# Patient Record
Sex: Female | Born: 1940
Health system: Southern US, Community
[De-identification: ages and names within clinical notes are randomized; demographics above are authoritative.]

## PROBLEM LIST (undated history)

## (undated) HISTORY — PX: TONSILECTOMY, ADENOIDECTOMY, BILATERAL MYRINGOTOMY AND TUBES: SHX2538

---

## 2000-06-25 ENCOUNTER — Encounter: Payer: Self-pay | Admitting: General Surgery

## 2000-06-25 ENCOUNTER — Encounter: Admission: RE | Admit: 2000-06-25 | Discharge: 2000-06-25 | Payer: Self-pay | Admitting: General Surgery

## 2001-07-13 ENCOUNTER — Encounter: Payer: Self-pay | Admitting: Unknown Physician Specialty

## 2001-07-13 ENCOUNTER — Encounter: Admission: RE | Admit: 2001-07-13 | Discharge: 2001-07-13 | Payer: Self-pay | Admitting: Unknown Physician Specialty

## 2002-07-14 ENCOUNTER — Encounter: Payer: Self-pay | Admitting: Unknown Physician Specialty

## 2002-07-14 ENCOUNTER — Encounter: Admission: RE | Admit: 2002-07-14 | Discharge: 2002-07-14 | Payer: Self-pay | Admitting: Unknown Physician Specialty

## 2003-09-12 ENCOUNTER — Encounter: Admission: RE | Admit: 2003-09-12 | Discharge: 2003-09-12 | Payer: Self-pay | Admitting: Unknown Physician Specialty

## 2003-09-28 ENCOUNTER — Encounter: Admission: RE | Admit: 2003-09-28 | Discharge: 2003-09-28 | Payer: Self-pay | Admitting: Unknown Physician Specialty

## 2004-10-16 ENCOUNTER — Encounter: Admission: RE | Admit: 2004-10-16 | Discharge: 2004-10-16 | Payer: Self-pay | Admitting: Unknown Physician Specialty

## 2005-12-31 ENCOUNTER — Encounter: Admission: RE | Admit: 2005-12-31 | Discharge: 2005-12-31 | Payer: Self-pay | Admitting: Unknown Physician Specialty

## 2007-02-24 ENCOUNTER — Encounter: Admission: RE | Admit: 2007-02-24 | Discharge: 2007-02-24 | Payer: Self-pay | Admitting: Unknown Physician Specialty

## 2008-02-29 ENCOUNTER — Encounter: Admission: RE | Admit: 2008-02-29 | Discharge: 2008-02-29 | Payer: Self-pay | Admitting: Internal Medicine

## 2009-03-13 ENCOUNTER — Encounter: Admission: RE | Admit: 2009-03-13 | Discharge: 2009-03-13 | Payer: Self-pay | Admitting: Internal Medicine

## 2010-04-04 ENCOUNTER — Encounter: Admission: RE | Admit: 2010-04-04 | Discharge: 2010-04-04 | Payer: Self-pay | Admitting: Internal Medicine

## 2011-05-26 ENCOUNTER — Other Ambulatory Visit: Payer: Self-pay | Admitting: Internal Medicine

## 2011-05-26 DIAGNOSIS — Z1231 Encounter for screening mammogram for malignant neoplasm of breast: Secondary | ICD-10-CM

## 2011-06-03 ENCOUNTER — Ambulatory Visit: Payer: Self-pay

## 2011-07-02 ENCOUNTER — Ambulatory Visit: Payer: Self-pay

## 2011-07-16 ENCOUNTER — Ambulatory Visit: Payer: Self-pay

## 2011-08-08 ENCOUNTER — Ambulatory Visit
Admission: RE | Admit: 2011-08-08 | Discharge: 2011-08-08 | Disposition: A | Discharge status: Home / Self Care | Payer: Medicare Other | Source: Ambulatory Visit | Attending: Internal Medicine | Admitting: Internal Medicine

## 2011-08-08 DIAGNOSIS — Z1231 Encounter for screening mammogram for malignant neoplasm of breast: Secondary | ICD-10-CM

## 2012-07-12 ENCOUNTER — Other Ambulatory Visit: Payer: Self-pay | Admitting: Internal Medicine

## 2012-07-12 DIAGNOSIS — Z1231 Encounter for screening mammogram for malignant neoplasm of breast: Secondary | ICD-10-CM

## 2012-08-17 ENCOUNTER — Ambulatory Visit: Payer: Medicare Other

## 2012-08-25 ENCOUNTER — Ambulatory Visit
Admission: RE | Admit: 2012-08-25 | Discharge: 2012-08-25 | Disposition: A | Discharge status: Home / Self Care | Payer: Medicare Other | Source: Ambulatory Visit | Attending: Internal Medicine | Admitting: Internal Medicine

## 2012-08-25 DIAGNOSIS — Z1231 Encounter for screening mammogram for malignant neoplasm of breast: Secondary | ICD-10-CM

## 2013-07-27 ENCOUNTER — Other Ambulatory Visit: Payer: Self-pay

## 2013-07-27 DIAGNOSIS — Z1231 Encounter for screening mammogram for malignant neoplasm of breast: Secondary | ICD-10-CM

## 2013-08-30 ENCOUNTER — Ambulatory Visit: Payer: Medicare Other

## 2013-09-07 ENCOUNTER — Ambulatory Visit
Admission: RE | Admit: 2013-09-07 | Discharge: 2013-09-07 | Disposition: A | Discharge status: Home / Self Care | Payer: Medicare Other | Source: Ambulatory Visit

## 2013-09-07 DIAGNOSIS — Z1231 Encounter for screening mammogram for malignant neoplasm of breast: Secondary | ICD-10-CM

## 2013-09-28 ENCOUNTER — Ambulatory Visit: Payer: Medicare Other

## 2014-09-14 ENCOUNTER — Other Ambulatory Visit: Payer: Self-pay

## 2014-09-14 DIAGNOSIS — Z1231 Encounter for screening mammogram for malignant neoplasm of breast: Secondary | ICD-10-CM

## 2014-09-27 ENCOUNTER — Ambulatory Visit
Admission: RE | Admit: 2014-09-27 | Discharge: 2014-09-27 | Disposition: A | Discharge status: Home / Self Care | Payer: Medicare Other | Source: Ambulatory Visit

## 2014-09-27 ENCOUNTER — Other Ambulatory Visit: Payer: Self-pay

## 2014-09-27 DIAGNOSIS — Z1231 Encounter for screening mammogram for malignant neoplasm of breast: Secondary | ICD-10-CM

## 2015-09-20 ENCOUNTER — Other Ambulatory Visit: Payer: Self-pay

## 2015-09-20 DIAGNOSIS — Z1231 Encounter for screening mammogram for malignant neoplasm of breast: Secondary | ICD-10-CM

## 2015-10-11 DIAGNOSIS — L570 Actinic keratosis: Secondary | ICD-10-CM | POA: Diagnosis not present

## 2015-10-17 ENCOUNTER — Ambulatory Visit
Admission: RE | Admit: 2015-10-17 | Discharge: 2015-10-17 | Disposition: A | Discharge status: Home / Self Care | Payer: Medicare Other | Source: Ambulatory Visit

## 2015-10-17 DIAGNOSIS — Z1231 Encounter for screening mammogram for malignant neoplasm of breast: Secondary | ICD-10-CM

## 2015-10-19 DIAGNOSIS — E7800 Pure hypercholesterolemia, unspecified: Secondary | ICD-10-CM | POA: Diagnosis not present

## 2015-11-13 DIAGNOSIS — Z418 Encounter for other procedures for purposes other than remedying health state: Secondary | ICD-10-CM | POA: Diagnosis not present

## 2015-11-13 DIAGNOSIS — Z87891 Personal history of nicotine dependence: Secondary | ICD-10-CM | POA: Diagnosis not present

## 2015-11-13 DIAGNOSIS — L539 Erythematous condition, unspecified: Secondary | ICD-10-CM | POA: Diagnosis not present

## 2015-11-13 DIAGNOSIS — W57XXXA Bitten or stung by nonvenomous insect and other nonvenomous arthropods, initial encounter: Secondary | ICD-10-CM | POA: Diagnosis not present

## 2016-01-14 DIAGNOSIS — E039 Hypothyroidism, unspecified: Secondary | ICD-10-CM | POA: Diagnosis not present

## 2016-01-14 DIAGNOSIS — E7800 Pure hypercholesterolemia, unspecified: Secondary | ICD-10-CM | POA: Diagnosis not present

## 2016-01-14 DIAGNOSIS — Z299 Encounter for prophylactic measures, unspecified: Secondary | ICD-10-CM | POA: Diagnosis not present

## 2016-01-14 DIAGNOSIS — M79671 Pain in right foot: Secondary | ICD-10-CM | POA: Diagnosis not present

## 2016-01-14 DIAGNOSIS — Z6824 Body mass index (BMI) 24.0-24.9, adult: Secondary | ICD-10-CM | POA: Diagnosis not present

## 2016-02-19 DIAGNOSIS — M1812 Unilateral primary osteoarthritis of first carpometacarpal joint, left hand: Secondary | ICD-10-CM | POA: Diagnosis not present

## 2016-02-19 DIAGNOSIS — M25532 Pain in left wrist: Secondary | ICD-10-CM | POA: Diagnosis not present

## 2016-02-19 DIAGNOSIS — E7800 Pure hypercholesterolemia, unspecified: Secondary | ICD-10-CM | POA: Diagnosis not present

## 2016-02-19 DIAGNOSIS — E039 Hypothyroidism, unspecified: Secondary | ICD-10-CM | POA: Diagnosis not present

## 2016-02-19 DIAGNOSIS — S6992XA Unspecified injury of left wrist, hand and finger(s), initial encounter: Secondary | ICD-10-CM | POA: Diagnosis not present

## 2016-03-04 DIAGNOSIS — M25532 Pain in left wrist: Secondary | ICD-10-CM | POA: Diagnosis not present

## 2016-03-04 DIAGNOSIS — M1812 Unilateral primary osteoarthritis of first carpometacarpal joint, left hand: Secondary | ICD-10-CM | POA: Diagnosis not present

## 2016-03-04 DIAGNOSIS — M654 Radial styloid tenosynovitis [de Quervain]: Secondary | ICD-10-CM | POA: Diagnosis not present

## 2016-04-21 DIAGNOSIS — M8580 Other specified disorders of bone density and structure, unspecified site: Secondary | ICD-10-CM | POA: Diagnosis not present

## 2016-04-21 DIAGNOSIS — E039 Hypothyroidism, unspecified: Secondary | ICD-10-CM | POA: Diagnosis not present

## 2016-06-09 DIAGNOSIS — Z23 Encounter for immunization: Secondary | ICD-10-CM | POA: Diagnosis not present

## 2016-07-21 DIAGNOSIS — Z79899 Other long term (current) drug therapy: Secondary | ICD-10-CM | POA: Diagnosis not present

## 2016-07-21 DIAGNOSIS — Z1211 Encounter for screening for malignant neoplasm of colon: Secondary | ICD-10-CM | POA: Diagnosis not present

## 2016-07-21 DIAGNOSIS — Z299 Encounter for prophylactic measures, unspecified: Secondary | ICD-10-CM | POA: Diagnosis not present

## 2016-07-21 DIAGNOSIS — Z7189 Other specified counseling: Secondary | ICD-10-CM | POA: Diagnosis not present

## 2016-07-21 DIAGNOSIS — Z6825 Body mass index (BMI) 25.0-25.9, adult: Secondary | ICD-10-CM | POA: Diagnosis not present

## 2016-07-21 DIAGNOSIS — Z1389 Encounter for screening for other disorder: Secondary | ICD-10-CM | POA: Diagnosis not present

## 2016-07-21 DIAGNOSIS — E039 Hypothyroidism, unspecified: Secondary | ICD-10-CM | POA: Diagnosis not present

## 2016-07-21 DIAGNOSIS — Z0000 Encounter for general adult medical examination without abnormal findings: Secondary | ICD-10-CM | POA: Diagnosis not present

## 2016-07-21 DIAGNOSIS — E7800 Pure hypercholesterolemia, unspecified: Secondary | ICD-10-CM | POA: Diagnosis not present

## 2016-07-30 ENCOUNTER — Other Ambulatory Visit: Payer: Self-pay | Admitting: Internal Medicine

## 2016-07-30 DIAGNOSIS — Z1231 Encounter for screening mammogram for malignant neoplasm of breast: Secondary | ICD-10-CM

## 2016-09-11 DIAGNOSIS — E2839 Other primary ovarian failure: Secondary | ICD-10-CM | POA: Diagnosis not present

## 2016-10-17 ENCOUNTER — Ambulatory Visit
Admission: RE | Admit: 2016-10-17 | Discharge: 2016-10-17 | Disposition: A | Discharge status: Home / Self Care | Payer: Medicare Other | Source: Ambulatory Visit | Attending: Internal Medicine | Admitting: Internal Medicine

## 2016-10-17 DIAGNOSIS — Z1231 Encounter for screening mammogram for malignant neoplasm of breast: Secondary | ICD-10-CM | POA: Diagnosis not present

## 2017-02-05 DIAGNOSIS — E7800 Pure hypercholesterolemia, unspecified: Secondary | ICD-10-CM | POA: Diagnosis not present

## 2017-02-05 DIAGNOSIS — E039 Hypothyroidism, unspecified: Secondary | ICD-10-CM | POA: Diagnosis not present

## 2017-02-05 DIAGNOSIS — Z713 Dietary counseling and surveillance: Secondary | ICD-10-CM | POA: Diagnosis not present

## 2017-02-05 DIAGNOSIS — Z6822 Body mass index (BMI) 22.0-22.9, adult: Secondary | ICD-10-CM | POA: Diagnosis not present

## 2017-02-05 DIAGNOSIS — Z299 Encounter for prophylactic measures, unspecified: Secondary | ICD-10-CM | POA: Diagnosis not present

## 2017-02-06 DIAGNOSIS — E7800 Pure hypercholesterolemia, unspecified: Secondary | ICD-10-CM | POA: Diagnosis not present

## 2017-02-06 DIAGNOSIS — E039 Hypothyroidism, unspecified: Secondary | ICD-10-CM | POA: Diagnosis not present

## 2017-03-06 DIAGNOSIS — E7800 Pure hypercholesterolemia, unspecified: Secondary | ICD-10-CM | POA: Diagnosis not present

## 2017-03-06 DIAGNOSIS — W57XXXA Bitten or stung by nonvenomous insect and other nonvenomous arthropods, initial encounter: Secondary | ICD-10-CM | POA: Diagnosis not present

## 2017-03-06 DIAGNOSIS — Z6822 Body mass index (BMI) 22.0-22.9, adult: Secondary | ICD-10-CM | POA: Diagnosis not present

## 2017-03-06 DIAGNOSIS — Z299 Encounter for prophylactic measures, unspecified: Secondary | ICD-10-CM | POA: Diagnosis not present

## 2017-03-06 DIAGNOSIS — M8580 Other specified disorders of bone density and structure, unspecified site: Secondary | ICD-10-CM | POA: Diagnosis not present

## 2017-03-06 DIAGNOSIS — E039 Hypothyroidism, unspecified: Secondary | ICD-10-CM | POA: Diagnosis not present

## 2017-06-22 DIAGNOSIS — Z23 Encounter for immunization: Secondary | ICD-10-CM | POA: Diagnosis not present

## 2017-07-24 DIAGNOSIS — Z1339 Encounter for screening examination for other mental health and behavioral disorders: Secondary | ICD-10-CM | POA: Diagnosis not present

## 2017-07-24 DIAGNOSIS — D229 Melanocytic nevi, unspecified: Secondary | ICD-10-CM | POA: Diagnosis not present

## 2017-07-24 DIAGNOSIS — Z7189 Other specified counseling: Secondary | ICD-10-CM | POA: Diagnosis not present

## 2017-07-24 DIAGNOSIS — Z6823 Body mass index (BMI) 23.0-23.9, adult: Secondary | ICD-10-CM | POA: Diagnosis not present

## 2017-07-24 DIAGNOSIS — Z1231 Encounter for screening mammogram for malignant neoplasm of breast: Secondary | ICD-10-CM | POA: Diagnosis not present

## 2017-07-24 DIAGNOSIS — Z79899 Other long term (current) drug therapy: Secondary | ICD-10-CM | POA: Diagnosis not present

## 2017-07-24 DIAGNOSIS — Z1331 Encounter for screening for depression: Secondary | ICD-10-CM | POA: Diagnosis not present

## 2017-07-24 DIAGNOSIS — Z0000 Encounter for general adult medical examination without abnormal findings: Secondary | ICD-10-CM | POA: Diagnosis not present

## 2017-07-24 DIAGNOSIS — E039 Hypothyroidism, unspecified: Secondary | ICD-10-CM | POA: Diagnosis not present

## 2017-07-24 DIAGNOSIS — E7800 Pure hypercholesterolemia, unspecified: Secondary | ICD-10-CM | POA: Diagnosis not present

## 2017-07-24 DIAGNOSIS — Z299 Encounter for prophylactic measures, unspecified: Secondary | ICD-10-CM | POA: Diagnosis not present

## 2017-07-30 DIAGNOSIS — Z79899 Other long term (current) drug therapy: Secondary | ICD-10-CM | POA: Diagnosis not present

## 2017-07-30 DIAGNOSIS — E7800 Pure hypercholesterolemia, unspecified: Secondary | ICD-10-CM | POA: Diagnosis not present

## 2017-07-30 DIAGNOSIS — E039 Hypothyroidism, unspecified: Secondary | ICD-10-CM | POA: Diagnosis not present

## 2017-08-20 DIAGNOSIS — L308 Other specified dermatitis: Secondary | ICD-10-CM | POA: Diagnosis not present

## 2017-08-20 DIAGNOSIS — Z1283 Encounter for screening for malignant neoplasm of skin: Secondary | ICD-10-CM | POA: Diagnosis not present

## 2017-08-20 DIAGNOSIS — D225 Melanocytic nevi of trunk: Secondary | ICD-10-CM | POA: Diagnosis not present

## 2017-11-16 ENCOUNTER — Other Ambulatory Visit: Payer: Self-pay | Admitting: Internal Medicine

## 2017-11-16 DIAGNOSIS — Z1231 Encounter for screening mammogram for malignant neoplasm of breast: Secondary | ICD-10-CM

## 2017-11-18 ENCOUNTER — Ambulatory Visit: Payer: Medicare Other

## 2017-11-30 DIAGNOSIS — H6690 Otitis media, unspecified, unspecified ear: Secondary | ICD-10-CM | POA: Diagnosis not present

## 2017-11-30 DIAGNOSIS — E039 Hypothyroidism, unspecified: Secondary | ICD-10-CM | POA: Diagnosis not present

## 2017-11-30 DIAGNOSIS — Z87891 Personal history of nicotine dependence: Secondary | ICD-10-CM | POA: Diagnosis not present

## 2017-11-30 DIAGNOSIS — Z6823 Body mass index (BMI) 23.0-23.9, adult: Secondary | ICD-10-CM | POA: Diagnosis not present

## 2017-11-30 DIAGNOSIS — Z299 Encounter for prophylactic measures, unspecified: Secondary | ICD-10-CM | POA: Diagnosis not present

## 2017-12-09 ENCOUNTER — Ambulatory Visit
Admission: RE | Admit: 2017-12-09 | Discharge: 2017-12-09 | Disposition: A | Discharge status: Home / Self Care | Payer: Medicare Other | Source: Ambulatory Visit | Attending: Internal Medicine | Admitting: Internal Medicine

## 2017-12-09 DIAGNOSIS — Z1231 Encounter for screening mammogram for malignant neoplasm of breast: Secondary | ICD-10-CM | POA: Diagnosis not present

## 2018-02-24 ENCOUNTER — Encounter: Payer: Self-pay | Admitting: Orthopaedic Surgery

## 2018-02-24 DIAGNOSIS — Z713 Dietary counseling and surveillance: Secondary | ICD-10-CM | POA: Diagnosis not present

## 2018-02-24 DIAGNOSIS — Z6824 Body mass index (BMI) 24.0-24.9, adult: Secondary | ICD-10-CM | POA: Diagnosis not present

## 2018-02-24 DIAGNOSIS — E039 Hypothyroidism, unspecified: Secondary | ICD-10-CM | POA: Diagnosis not present

## 2018-02-24 DIAGNOSIS — Z299 Encounter for prophylactic measures, unspecified: Secondary | ICD-10-CM | POA: Diagnosis not present

## 2018-02-24 DIAGNOSIS — M1711 Unilateral primary osteoarthritis, right knee: Secondary | ICD-10-CM | POA: Diagnosis not present

## 2018-02-24 DIAGNOSIS — M25561 Pain in right knee: Secondary | ICD-10-CM | POA: Diagnosis not present

## 2018-03-12 DIAGNOSIS — Z79899 Other long term (current) drug therapy: Secondary | ICD-10-CM | POA: Diagnosis not present

## 2018-03-12 DIAGNOSIS — Z713 Dietary counseling and surveillance: Secondary | ICD-10-CM | POA: Diagnosis not present

## 2018-03-12 DIAGNOSIS — Z6823 Body mass index (BMI) 23.0-23.9, adult: Secondary | ICD-10-CM | POA: Diagnosis not present

## 2018-03-12 DIAGNOSIS — M1711 Unilateral primary osteoarthritis, right knee: Secondary | ICD-10-CM | POA: Diagnosis not present

## 2018-03-12 DIAGNOSIS — Z299 Encounter for prophylactic measures, unspecified: Secondary | ICD-10-CM | POA: Diagnosis not present

## 2018-03-12 DIAGNOSIS — E039 Hypothyroidism, unspecified: Secondary | ICD-10-CM | POA: Diagnosis not present

## 2018-03-25 ENCOUNTER — Encounter: Payer: Self-pay | Admitting: Orthopaedic Surgery

## 2018-03-25 ENCOUNTER — Ambulatory Visit (INDEPENDENT_AMBULATORY_CARE_PROVIDER_SITE_OTHER): Payer: Medicare Other | Admitting: Orthopaedic Surgery

## 2018-03-25 VITALS — BP 138/89 | HR 69 | Temp 98.1°F | Ht 64.0 in | Wt 129.0 lb

## 2018-03-25 DIAGNOSIS — M25561 Pain in right knee: Secondary | ICD-10-CM

## 2018-03-25 DIAGNOSIS — G8929 Other chronic pain: Secondary | ICD-10-CM | POA: Diagnosis not present

## 2018-03-25 NOTE — Progress Notes (Signed)
Subjective:    Patient ID: Lynn Cuevas, female    DOB: 1941/05/31, 77 y.o.   MRN: [id number]  HPI She fell about six weeks ago and hurt her right knee.  She has had pain since then on and off and it is getting slowly worse.  She has tried ice, Advil and Aleve.  The Aleve works best. She has no giving way.  She has swelling and popping.  She has difficulty in walking distances secondary to the right knee hurting.  She has no distal edema.   Review of Systems  Constitutional: Positive for activity change.  Cardiovascular: Positive for leg swelling. Negative for chest pain.  Musculoskeletal: Positive for arthralgias, gait problem and joint swelling.  All other systems reviewed and are negative.  For Review of Systems, all other systems reviewed and are negative.  History reviewed. No pertinent past medical history.  Past Surgical History:  Procedure Laterality Date  . TONSILECTOMY, ADENOIDECTOMY, BILATERAL MYRINGOTOMY AND TUBES      No current outpatient medications on file prior to visit.   No current facility-administered medications on file prior to visit.     Social History   Socioeconomic History  . Marital status: Married    Spouse name: Not on file  . Number of children: Not on file  . Years of education: Not on file  . Highest education level: Not on file  Occupational History  . Not on file  Social Needs  . Financial resource strain: Not on file  . Food insecurity:    Worry: Not on file    Inability: Not on file  . Transportation needs:    Medical: Not on file    Non-medical: Not on file  Tobacco Use  . Smoking status: Not on file  Substance and Sexual Activity  . Alcohol use: Not on file  . Drug use: Not on file  . Sexual activity: Not on file  Lifestyle  . Physical activity:    Days per week: Not on file    Minutes per session: Not on file  . Stress: Not on file  Relationships  . Social connections:    Talks on phone: Not on file    Gets  together: Not on file    Attends religious service: Not on file    Active member of club or organization: Not on file    Attends meetings of clubs or organizations: Not on file    Relationship status: Not on file  . Intimate partner violence:    Fear of current or ex partner: Not on file    Emotionally abused: Not on file    Physically abused: Not on file    Forced sexual activity: Not on file  Other Topics Concern  . Not on file  Social History Narrative  . Not on file    Family History  Problem Relation Age of Onset  . Diabetes Father   . Cancer Sister   . Alzheimer's disease Sister     BP 138/89   Pulse 69   Temp 98.1 F (36.7 C)   Ht 5\' 4"  (1.626 m)   Wt 129 lb (58.5 kg)   BMI 22.14 kg/m   Body mass index is 22.14 kg/m.      Objective:   Physical Exam  Constitutional: She is oriented to person, place, and time. She appears well-developed and well-nourished.  HENT:  Head: Normocephalic and atraumatic.  Eyes: Pupils are equal, round, and reactive to light. Conjunctivae  and EOM are normal.  Neck: Normal range of motion. Neck supple.  Cardiovascular: Normal rate, regular rhythm and intact distal pulses.  Pulmonary/Chest: Effort normal.  Abdominal: Soft.  Musculoskeletal:       Right knee: She exhibits decreased range of motion and swelling. Tenderness found. Medial joint line tenderness noted.       Legs: Neurological: She is alert and oriented to person, place, and time. She has normal reflexes. She displays normal reflexes. No cranial nerve deficit. She exhibits normal muscle tone. Coordination normal.  Skin: Skin is warm and dry.  Psychiatric: She has a normal mood and affect. Her behavior is normal. Judgment and thought content normal.     X-rays and report reviewed and notes from family doctor in [place] reviewed.     Assessment & Plan:   Encounter Diagnosis  Name Primary?  . Chronic pain of right knee Yes   PROCEDURE NOTE:  The patient requests  injections of the right knee , verbal consent was obtained.  The right knee was prepped appropriately after time out was performed.   Sterile technique was observed and injection of 1 cc of Depo-Medrol 40 mg with several cc's of plain xylocaine. Anesthesia was provided by ethyl chloride and a 20-gauge needle was used to inject the knee area. The injection was tolerated well.  A band aid dressing was applied.  The patient was advised to apply ice later today and tomorrow to the injection sight as needed.  I will see her in two weeks.  Continue the Aleve.  She may need MRI.  Call if any problem.  Precautions discussed.   Electronically Signed Sanjuana Kava, MD 7/11/20192:12 PM

## 2018-04-08 ENCOUNTER — Encounter: Payer: Self-pay | Admitting: Orthopaedic Surgery

## 2018-04-08 ENCOUNTER — Ambulatory Visit (INDEPENDENT_AMBULATORY_CARE_PROVIDER_SITE_OTHER): Payer: Medicare Other | Admitting: Orthopaedic Surgery

## 2018-04-08 VITALS — BP 151/87 | HR 70 | Temp 98.3°F | Ht 64.0 in | Wt 130.0 lb

## 2018-04-08 DIAGNOSIS — M25561 Pain in right knee: Secondary | ICD-10-CM | POA: Diagnosis not present

## 2018-04-08 DIAGNOSIS — G8929 Other chronic pain: Secondary | ICD-10-CM

## 2018-04-08 DIAGNOSIS — M5442 Lumbago with sciatica, left side: Secondary | ICD-10-CM | POA: Diagnosis not present

## 2018-04-08 NOTE — Progress Notes (Signed)
Patient ZO:XWRUE ELKA Cuevas, female DOB:08/27/1941, 77 y.o. AVW:[id number]  Chief Complaint  Patient presents with  . Knee Pain    Recheck on right knee pain.    HPI  Lynn Cuevas is a 77 y.o. female who has right knee pain and lower back pain.  Her right knee is improved since the injection last time. She has less swelling and less pain.  She is taking Aleve which helps.  She has some pain from the lower back to the left thigh.  She had spasm the other night of the thigh.  I have given recommendations for this.   Body mass index is 22.31 kg/m.  ROS  Review of Systems  Constitutional: Positive for activity change.  Cardiovascular: Positive for leg swelling. Negative for chest pain.  Musculoskeletal: Positive for arthralgias, gait problem and joint swelling.  All other systems reviewed and are negative.   All other systems reviewed and are negative.  History reviewed. No pertinent past medical history.  Past Surgical History:  Procedure Laterality Date  . TONSILECTOMY, ADENOIDECTOMY, BILATERAL MYRINGOTOMY AND TUBES      Family History  Problem Relation Age of Onset  . Diabetes Father   . Cancer Sister   . Alzheimer's disease Sister     Social History Social History   Tobacco Use  . Smoking status: Not on file  Substance Use Topics  . Alcohol use: Not on file  . Drug use: Not on file    No Known Allergies  No current outpatient medications on file.   No current facility-administered medications for this visit.      Physical Exam  Blood pressure (!) 151/87, pulse 70, temperature 98.3 F (36.8 C), height 5\' 4"  (1.626 m), weight 130 lb (59 kg).  Constitutional: overall normal hygiene, normal nutrition, well developed, normal grooming, normal body habitus. Assistive device:none  Musculoskeletal: gait and station Limp none, muscle tone and strength are normal, no tremors or atrophy is present.  .  Neurological: coordination overall normal.  Deep tendon  reflex/nerve stretch intact.  Sensation normal.  Cranial nerves II-XII intact.   Skin:   Normal overall no scars, lesions, ulcers or rashes. No psoriasis.  Psychiatric: Alert and oriented x 3.  Recent memory intact, remote memory unclear.  Normal mood and affect. Well groomed.  Good eye contact.  Cardiovascular: overall no swelling, no varicosities, no edema bilaterally, normal temperatures of the legs and arms, no clubbing, cyanosis and good capillary refill.  Lymphatic: palpation is normal.  Right knee has crepitus, ROM 0 to 110, no limp today, NV intact, knee is stable.  Spine/Pelvis examination:  Inspection:  Overall, sacoiliac joint benign and hips nontender; without crepitus or defects.   Thoracic spine inspection: Alignment normal without kyphosis present   Lumbar spine inspection:  Alignment  with normal lumbar lordosis, without scoliosis apparent.   Thoracic spine palpation:  without tenderness of spinal processes   Lumbar spine palpation: without tenderness of lumbar area; without tightness of lumbar muscles    Range of Motion:   Lumbar flexion, forward flexion is normal without pain or tenderness    Lumbar extension is full without pain or tenderness   Left lateral bend is normal without pain or tenderness   Right lateral bend is normal without pain or tenderness   Straight leg raising is normal  Strength & tone: normal   Stability overall normal stability All other systems reviewed and are negative   The patient has been educated about the nature of  the problem(s) and counseled on treatment options.  The patient appeared to understand what I have discussed and is in agreement with it.  Encounter Diagnoses  Name Primary?  . Chronic pain of right knee Yes  . Chronic left-sided low back pain with left-sided sciatica     PLAN Call if any problems.  Precautions discussed.  Continue current medications.   Return to clinic 3 weeks   Electronically Signed Sanjuana Kava, MD 7/25/20192:05 PM

## 2018-04-14 ENCOUNTER — Telehealth: Payer: Self-pay | Admitting: Orthopaedic Surgery

## 2018-04-14 MED ORDER — HYDROCODONE-ACETAMINOPHEN 5-325 MG PO TABS: ORAL_TABLET | ORAL | 0 refills | Status: AC

## 2018-04-14 NOTE — Telephone Encounter (Signed)
Patient called back at 2:10.  She wanted to know if Dr. Luna Glasgow had responded back to her earlier message.  I told her that I didn't see any response as of yet.  She said that the pain is just terrible.  She said Ibuprofen(Aleve) is not helping. She said the heating pad does help but that she cant stay on it constantly.    She wants to know what to do.  She would like to have something for pain but not anything that is addictive.  She states she uses [facility] on [address]

## 2018-04-14 NOTE — Telephone Encounter (Signed)
Patient called to ask advice as to the pain she is experiencing-knee pain,radiating; states it has been unbearable. Relays that Dr Luna Glasgow advised ibuprofen, and that this is not helping. She also relayed that her referring primary care, Dr Lenon Oms, did prescribe Hydrocodone, but that she tore up the prescription.  States she can come in today, if advised.

## 2018-04-29 ENCOUNTER — Ambulatory Visit (INDEPENDENT_AMBULATORY_CARE_PROVIDER_SITE_OTHER): Payer: Medicare Other | Admitting: Orthopaedic Surgery

## 2018-04-29 ENCOUNTER — Encounter: Payer: Self-pay | Admitting: Orthopaedic Surgery

## 2018-04-29 VITALS — BP 123/77 | HR 72 | Ht 64.0 in | Wt 130.0 lb

## 2018-04-29 DIAGNOSIS — M25561 Pain in right knee: Secondary | ICD-10-CM

## 2018-04-29 DIAGNOSIS — G8929 Other chronic pain: Secondary | ICD-10-CM | POA: Diagnosis not present

## 2018-04-29 NOTE — Progress Notes (Signed)
CC:  My knee is wonderful now.  She has no pain of the right knee.  She is walking well.  She has full ROM of the right knee and no pain today.  Gait is normal.  NV intact.  Encounter Diagnosis  Name Primary?  . Chronic pain of right knee Yes   I will see her as needed.  Call if any problem.  Precautions discussed.   Electronically Signed Sanjuana Kava, MD 8/15/20192:52 PM

## 2018-06-09 DIAGNOSIS — Z23 Encounter for immunization: Secondary | ICD-10-CM | POA: Diagnosis not present

## 2018-06-30 IMAGING — MG DIGITAL SCREENING BILATERAL MAMMOGRAM WITH TOMO AND CAD
8 series · 8 of 24 positions shown · non-contrast
Comparison: Previous exam(s).

CLINICAL DATA: Screening.

EXAM:
DIGITAL SCREENING BILATERAL MAMMOGRAM WITH TOMO AND CAD

[L MLO synth-2D]
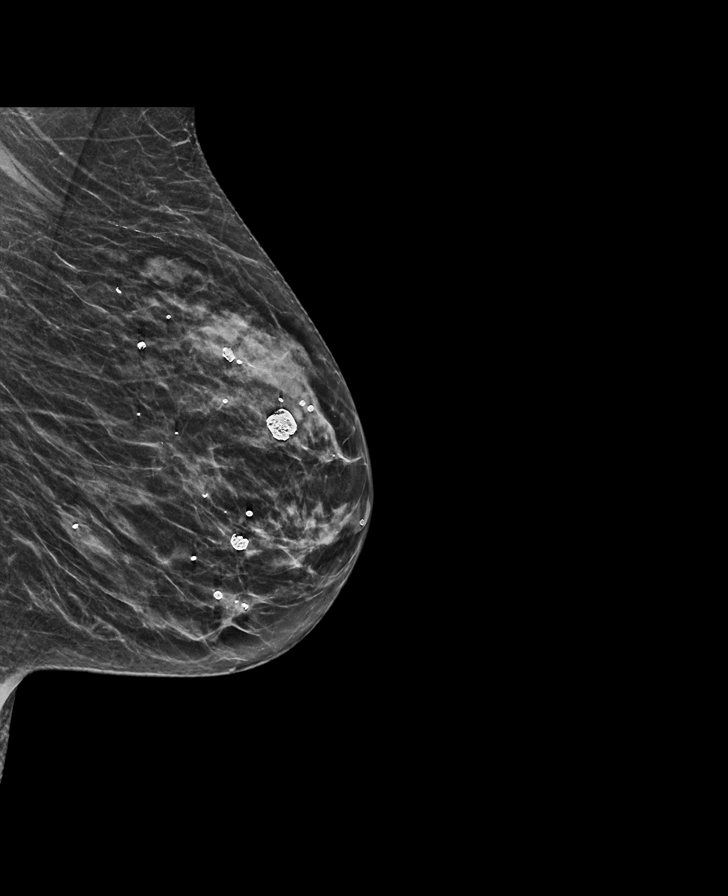

[R CC synth-2D]
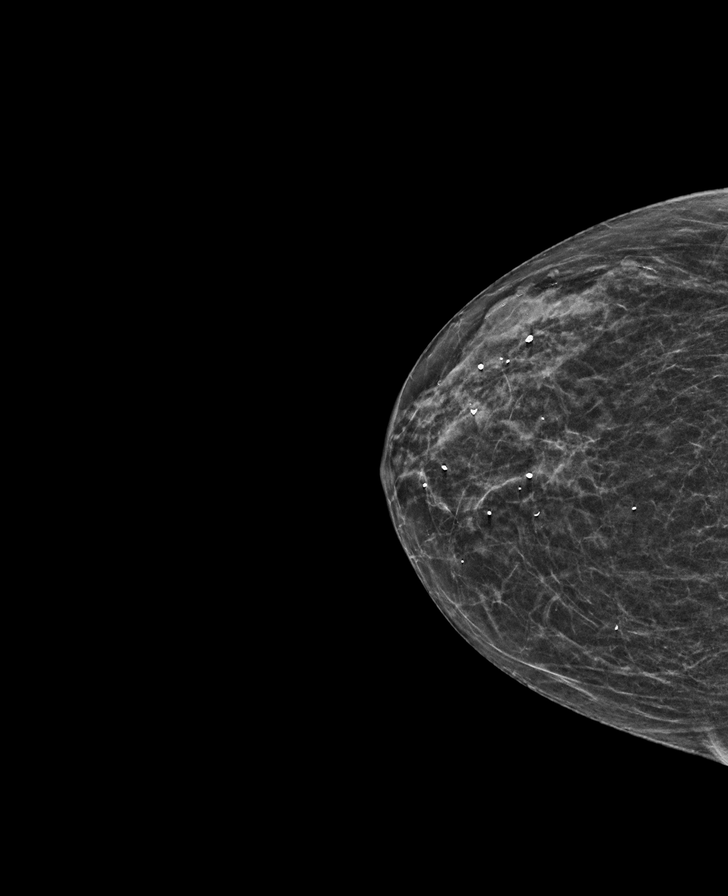

[R MLO synth-2D]
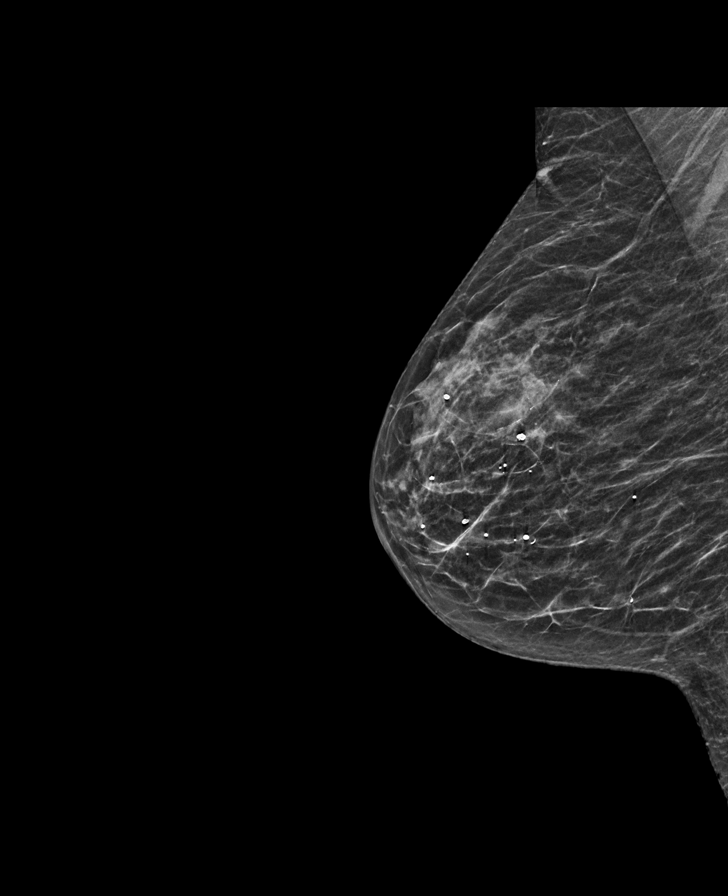

[L CC synth-2D]
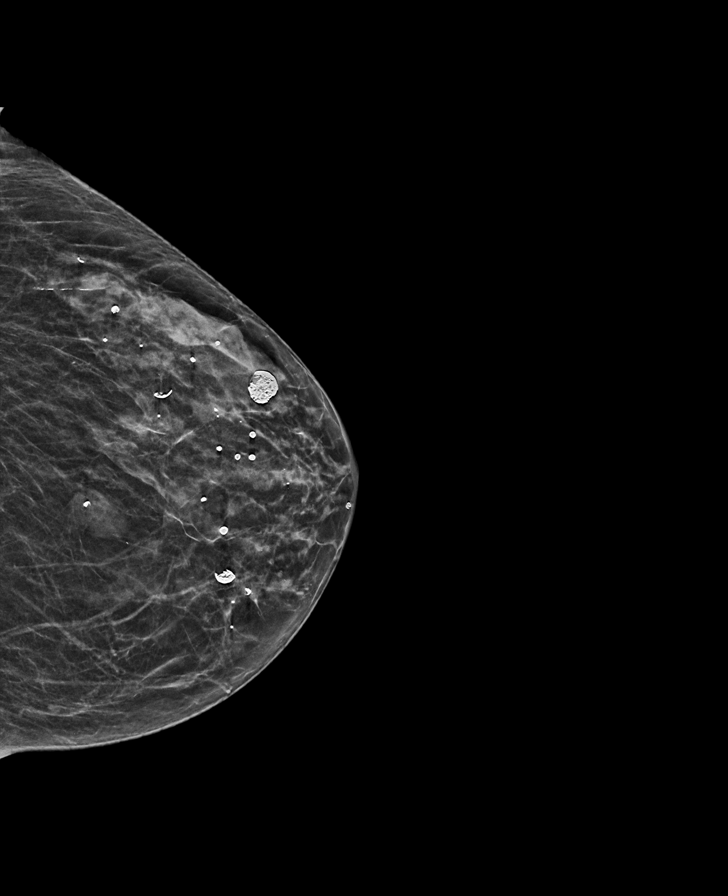

[R CC tomo · tomo slice 22/43.0]
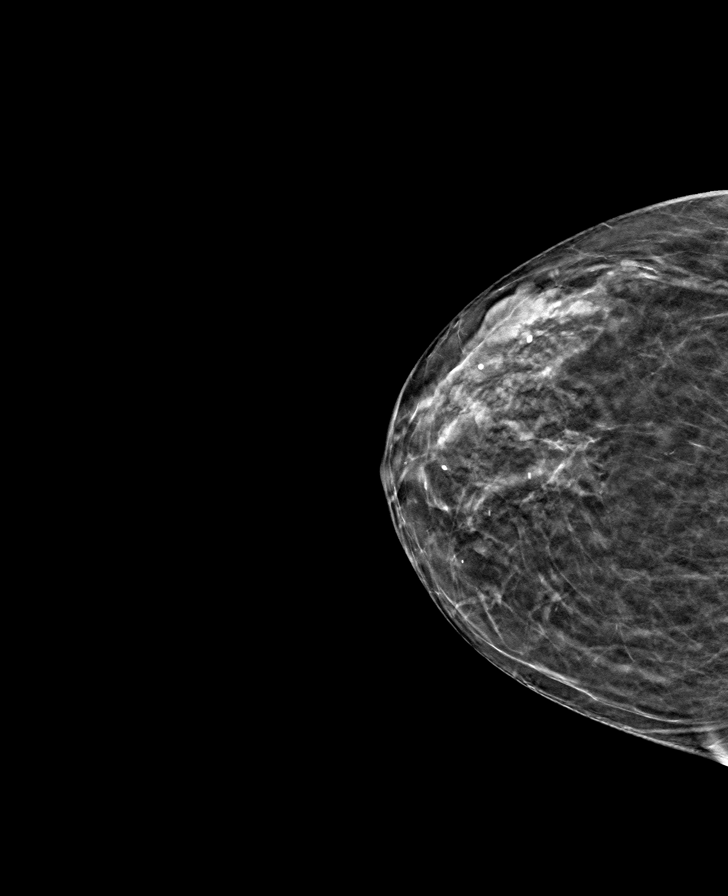

[L MLO tomo · tomo slice 23/45.0]
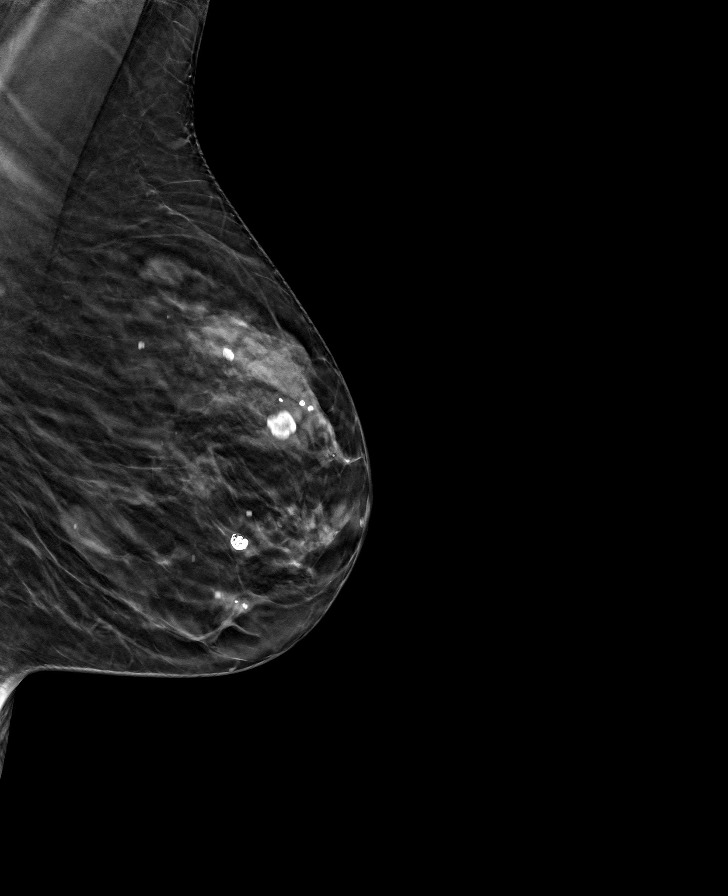

[R MLO tomo · tomo slice 22/43.0]
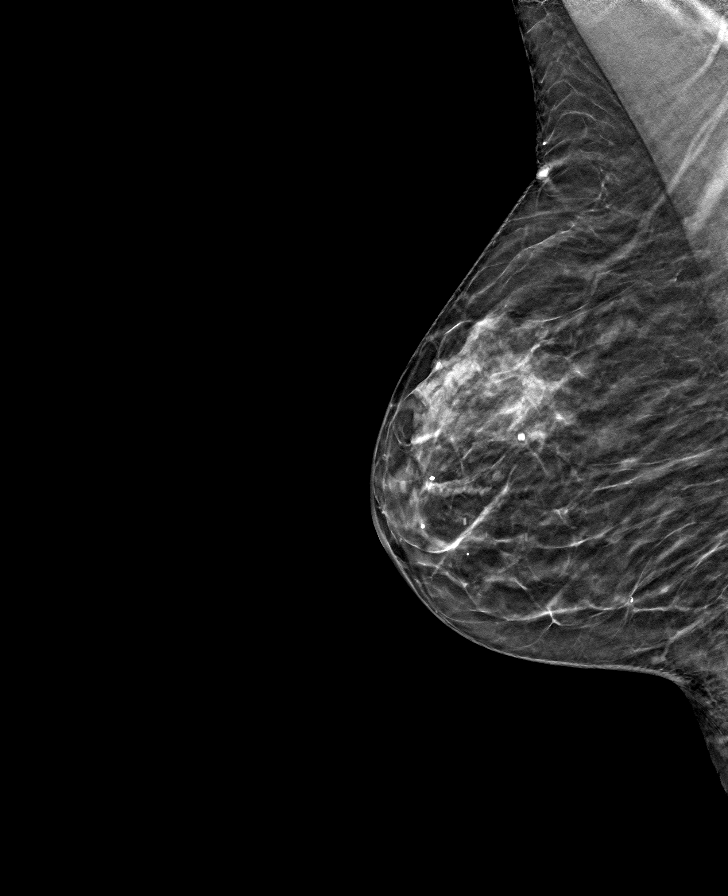

[L CC tomo · tomo slice 23/44.0]
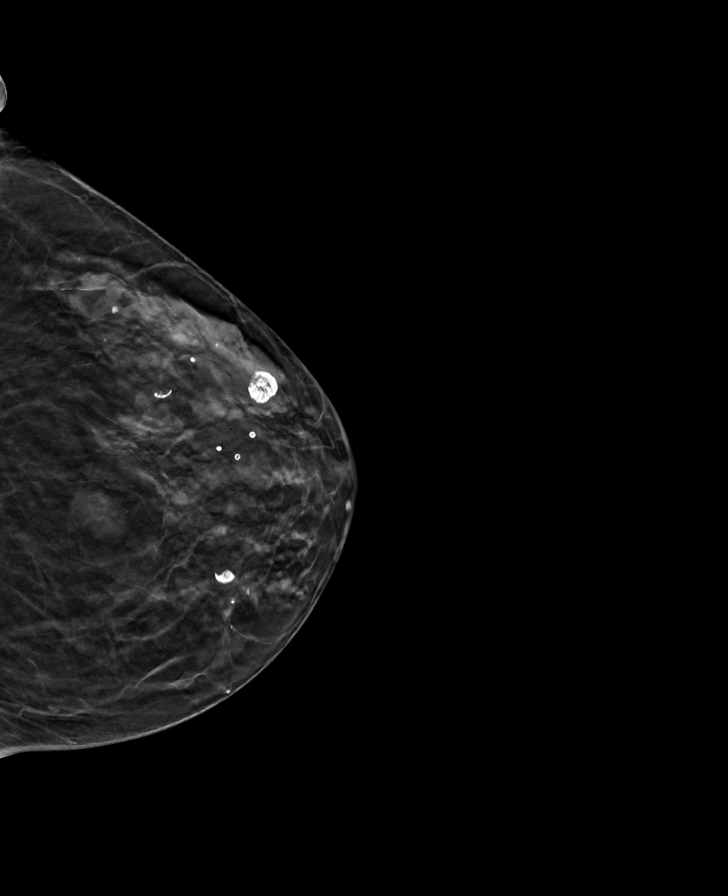

[8 of 24 positions shown; findings below may reference images not displayed]

ACR Breast Density Category c: The breast tissue is heterogeneously
dense, which may obscure small masses.
FINDINGS: There are no findings suspicious for malignancy. Images were
processed with CAD.
IMPRESSION: No mammographic evidence of malignancy. A result letter of this
screening mammogram will be mailed directly to the patient.

RECOMMENDATION:
Screening mammogram in one year. (Code:FT-U-LHB)

BI-RADS CATEGORY  1: Negative.

## 2018-07-28 DIAGNOSIS — Z1331 Encounter for screening for depression: Secondary | ICD-10-CM | POA: Diagnosis not present

## 2018-07-28 DIAGNOSIS — Z1211 Encounter for screening for malignant neoplasm of colon: Secondary | ICD-10-CM | POA: Diagnosis not present

## 2018-07-28 DIAGNOSIS — Z299 Encounter for prophylactic measures, unspecified: Secondary | ICD-10-CM | POA: Diagnosis not present

## 2018-07-28 DIAGNOSIS — E2839 Other primary ovarian failure: Secondary | ICD-10-CM | POA: Diagnosis not present

## 2018-07-28 DIAGNOSIS — Z0000 Encounter for general adult medical examination without abnormal findings: Secondary | ICD-10-CM | POA: Diagnosis not present

## 2018-07-28 DIAGNOSIS — Z6823 Body mass index (BMI) 23.0-23.9, adult: Secondary | ICD-10-CM | POA: Diagnosis not present

## 2018-07-28 DIAGNOSIS — E039 Hypothyroidism, unspecified: Secondary | ICD-10-CM | POA: Diagnosis not present

## 2018-07-28 DIAGNOSIS — E7800 Pure hypercholesterolemia, unspecified: Secondary | ICD-10-CM | POA: Diagnosis not present

## 2018-07-28 DIAGNOSIS — Z79899 Other long term (current) drug therapy: Secondary | ICD-10-CM | POA: Diagnosis not present

## 2018-07-28 DIAGNOSIS — Z7189 Other specified counseling: Secondary | ICD-10-CM | POA: Diagnosis not present

## 2018-07-28 DIAGNOSIS — Z1339 Encounter for screening examination for other mental health and behavioral disorders: Secondary | ICD-10-CM | POA: Diagnosis not present

## 2018-08-17 DIAGNOSIS — G9009 Other idiopathic peripheral autonomic neuropathy: Secondary | ICD-10-CM | POA: Diagnosis not present

## 2018-08-17 DIAGNOSIS — H81393 Other peripheral vertigo, bilateral: Secondary | ICD-10-CM | POA: Diagnosis not present

## 2018-08-17 DIAGNOSIS — I7389 Other specified peripheral vascular diseases: Secondary | ICD-10-CM | POA: Diagnosis not present

## 2018-10-14 DIAGNOSIS — E2839 Other primary ovarian failure: Secondary | ICD-10-CM | POA: Diagnosis not present

## 2018-10-14 DIAGNOSIS — M810 Age-related osteoporosis without current pathological fracture: Secondary | ICD-10-CM | POA: Diagnosis not present

## 2018-10-28 DIAGNOSIS — E7800 Pure hypercholesterolemia, unspecified: Secondary | ICD-10-CM | POA: Diagnosis not present

## 2018-10-28 DIAGNOSIS — E039 Hypothyroidism, unspecified: Secondary | ICD-10-CM | POA: Diagnosis not present

## 2018-10-28 DIAGNOSIS — Z87891 Personal history of nicotine dependence: Secondary | ICD-10-CM | POA: Diagnosis not present

## 2018-10-28 DIAGNOSIS — R42 Dizziness and giddiness: Secondary | ICD-10-CM | POA: Diagnosis not present

## 2018-10-28 DIAGNOSIS — Z6823 Body mass index (BMI) 23.0-23.9, adult: Secondary | ICD-10-CM | POA: Diagnosis not present

## 2018-10-28 DIAGNOSIS — M8580 Other specified disorders of bone density and structure, unspecified site: Secondary | ICD-10-CM | POA: Diagnosis not present

## 2018-10-28 DIAGNOSIS — Z299 Encounter for prophylactic measures, unspecified: Secondary | ICD-10-CM | POA: Diagnosis not present

## 2018-11-02 DIAGNOSIS — H81393 Other peripheral vertigo, bilateral: Secondary | ICD-10-CM | POA: Diagnosis not present

## 2019-05-26 DIAGNOSIS — Z23 Encounter for immunization: Secondary | ICD-10-CM | POA: Diagnosis not present

## 2019-08-03 DIAGNOSIS — Z0000 Encounter for general adult medical examination without abnormal findings: Secondary | ICD-10-CM | POA: Diagnosis not present

## 2019-08-03 DIAGNOSIS — R5383 Other fatigue: Secondary | ICD-10-CM | POA: Diagnosis not present

## 2019-08-03 DIAGNOSIS — Z299 Encounter for prophylactic measures, unspecified: Secondary | ICD-10-CM | POA: Diagnosis not present

## 2019-08-03 DIAGNOSIS — Z6824 Body mass index (BMI) 24.0-24.9, adult: Secondary | ICD-10-CM | POA: Diagnosis not present

## 2019-08-03 DIAGNOSIS — Z1339 Encounter for screening examination for other mental health and behavioral disorders: Secondary | ICD-10-CM | POA: Diagnosis not present

## 2019-08-03 DIAGNOSIS — E7800 Pure hypercholesterolemia, unspecified: Secondary | ICD-10-CM | POA: Diagnosis not present

## 2019-08-03 DIAGNOSIS — Z1331 Encounter for screening for depression: Secondary | ICD-10-CM | POA: Diagnosis not present

## 2019-08-03 DIAGNOSIS — Z7189 Other specified counseling: Secondary | ICD-10-CM | POA: Diagnosis not present

## 2019-08-03 DIAGNOSIS — E039 Hypothyroidism, unspecified: Secondary | ICD-10-CM | POA: Diagnosis not present

## 2019-08-03 DIAGNOSIS — Z1211 Encounter for screening for malignant neoplasm of colon: Secondary | ICD-10-CM | POA: Diagnosis not present

## 2019-10-20 DIAGNOSIS — Z1231 Encounter for screening mammogram for malignant neoplasm of breast: Secondary | ICD-10-CM | POA: Diagnosis not present

## 2020-02-07 DIAGNOSIS — Z299 Encounter for prophylactic measures, unspecified: Secondary | ICD-10-CM | POA: Diagnosis not present

## 2020-02-07 DIAGNOSIS — E039 Hypothyroidism, unspecified: Secondary | ICD-10-CM | POA: Diagnosis not present

## 2020-02-07 DIAGNOSIS — M8580 Other specified disorders of bone density and structure, unspecified site: Secondary | ICD-10-CM | POA: Diagnosis not present

## 2020-02-07 DIAGNOSIS — R413 Other amnesia: Secondary | ICD-10-CM | POA: Diagnosis not present

## 2020-02-07 DIAGNOSIS — Z87891 Personal history of nicotine dependence: Secondary | ICD-10-CM | POA: Diagnosis not present

## 2020-06-27 DIAGNOSIS — R319 Hematuria, unspecified: Secondary | ICD-10-CM | POA: Diagnosis not present

## 2020-06-27 DIAGNOSIS — N390 Urinary tract infection, site not specified: Secondary | ICD-10-CM | POA: Diagnosis not present

## 2020-06-27 DIAGNOSIS — Z299 Encounter for prophylactic measures, unspecified: Secondary | ICD-10-CM | POA: Diagnosis not present

## 2020-08-07 DIAGNOSIS — Z23 Encounter for immunization: Secondary | ICD-10-CM | POA: Diagnosis not present

## 2020-08-07 DIAGNOSIS — Z79899 Other long term (current) drug therapy: Secondary | ICD-10-CM | POA: Diagnosis not present

## 2020-08-07 DIAGNOSIS — Z1339 Encounter for screening examination for other mental health and behavioral disorders: Secondary | ICD-10-CM | POA: Diagnosis not present

## 2020-08-07 DIAGNOSIS — E7800 Pure hypercholesterolemia, unspecified: Secondary | ICD-10-CM | POA: Diagnosis not present

## 2020-08-07 DIAGNOSIS — Z6824 Body mass index (BMI) 24.0-24.9, adult: Secondary | ICD-10-CM | POA: Diagnosis not present

## 2020-08-07 DIAGNOSIS — E039 Hypothyroidism, unspecified: Secondary | ICD-10-CM | POA: Diagnosis not present

## 2020-08-07 DIAGNOSIS — Z7189 Other specified counseling: Secondary | ICD-10-CM | POA: Diagnosis not present

## 2020-08-07 DIAGNOSIS — Z299 Encounter for prophylactic measures, unspecified: Secondary | ICD-10-CM | POA: Diagnosis not present

## 2020-08-07 DIAGNOSIS — Z87891 Personal history of nicotine dependence: Secondary | ICD-10-CM | POA: Diagnosis not present

## 2020-08-07 DIAGNOSIS — R5383 Other fatigue: Secondary | ICD-10-CM | POA: Diagnosis not present

## 2020-08-07 DIAGNOSIS — Z1331 Encounter for screening for depression: Secondary | ICD-10-CM | POA: Diagnosis not present

## 2020-08-07 DIAGNOSIS — Z0000 Encounter for general adult medical examination without abnormal findings: Secondary | ICD-10-CM | POA: Diagnosis not present

## 2021-09-27 DIAGNOSIS — Z0000 Encounter for general adult medical examination without abnormal findings: Secondary | ICD-10-CM | POA: Diagnosis not present

## 2021-09-27 DIAGNOSIS — Z299 Encounter for prophylactic measures, unspecified: Secondary | ICD-10-CM | POA: Diagnosis not present

## 2021-09-27 DIAGNOSIS — Z7189 Other specified counseling: Secondary | ICD-10-CM | POA: Diagnosis not present

## 2021-09-27 DIAGNOSIS — Z79899 Other long term (current) drug therapy: Secondary | ICD-10-CM | POA: Diagnosis not present

## 2021-09-27 DIAGNOSIS — Z87891 Personal history of nicotine dependence: Secondary | ICD-10-CM | POA: Diagnosis not present

## 2021-09-27 DIAGNOSIS — Z1339 Encounter for screening examination for other mental health and behavioral disorders: Secondary | ICD-10-CM | POA: Diagnosis not present

## 2021-09-27 DIAGNOSIS — Z1331 Encounter for screening for depression: Secondary | ICD-10-CM | POA: Diagnosis not present

## 2021-09-27 DIAGNOSIS — E039 Hypothyroidism, unspecified: Secondary | ICD-10-CM | POA: Diagnosis not present

## 2021-09-27 DIAGNOSIS — Z6825 Body mass index (BMI) 25.0-25.9, adult: Secondary | ICD-10-CM | POA: Diagnosis not present

## 2021-09-27 DIAGNOSIS — E7800 Pure hypercholesterolemia, unspecified: Secondary | ICD-10-CM | POA: Diagnosis not present

## 2021-09-27 DIAGNOSIS — R5383 Other fatigue: Secondary | ICD-10-CM | POA: Diagnosis not present

## 2021-10-28 DIAGNOSIS — E2839 Other primary ovarian failure: Secondary | ICD-10-CM | POA: Diagnosis not present

## 2022-02-06 DIAGNOSIS — B028 Zoster with other complications: Secondary | ICD-10-CM | POA: Diagnosis not present

## 2022-02-06 DIAGNOSIS — Z87891 Personal history of nicotine dependence: Secondary | ICD-10-CM | POA: Diagnosis not present

## 2022-02-06 DIAGNOSIS — E039 Hypothyroidism, unspecified: Secondary | ICD-10-CM | POA: Diagnosis not present

## 2022-02-06 DIAGNOSIS — B0229 Other postherpetic nervous system involvement: Secondary | ICD-10-CM | POA: Diagnosis not present

## 2022-02-06 DIAGNOSIS — Z299 Encounter for prophylactic measures, unspecified: Secondary | ICD-10-CM | POA: Diagnosis not present

## 2022-02-19 DIAGNOSIS — Z789 Other specified health status: Secondary | ICD-10-CM | POA: Diagnosis not present

## 2022-02-19 DIAGNOSIS — Z299 Encounter for prophylactic measures, unspecified: Secondary | ICD-10-CM | POA: Diagnosis not present

## 2022-02-19 DIAGNOSIS — Z6825 Body mass index (BMI) 25.0-25.9, adult: Secondary | ICD-10-CM | POA: Diagnosis not present

## 2022-02-19 DIAGNOSIS — G4700 Insomnia, unspecified: Secondary | ICD-10-CM | POA: Diagnosis not present

## 2022-02-19 DIAGNOSIS — B028 Zoster with other complications: Secondary | ICD-10-CM | POA: Diagnosis not present

## 2022-07-17 DIAGNOSIS — F0390 Unspecified dementia without behavioral disturbance: Secondary | ICD-10-CM | POA: Diagnosis not present

## 2022-07-17 DIAGNOSIS — Z299 Encounter for prophylactic measures, unspecified: Secondary | ICD-10-CM | POA: Diagnosis not present

## 2022-07-17 DIAGNOSIS — B0229 Other postherpetic nervous system involvement: Secondary | ICD-10-CM | POA: Diagnosis not present

## 2022-07-17 DIAGNOSIS — L84 Corns and callosities: Secondary | ICD-10-CM | POA: Diagnosis not present

## 2022-07-17 DIAGNOSIS — L989 Disorder of the skin and subcutaneous tissue, unspecified: Secondary | ICD-10-CM | POA: Diagnosis not present

## 2022-10-06 DIAGNOSIS — Z1339 Encounter for screening examination for other mental health and behavioral disorders: Secondary | ICD-10-CM | POA: Diagnosis not present

## 2022-10-06 DIAGNOSIS — F0390 Unspecified dementia without behavioral disturbance: Secondary | ICD-10-CM | POA: Diagnosis not present

## 2022-10-06 DIAGNOSIS — R5383 Other fatigue: Secondary | ICD-10-CM | POA: Diagnosis not present

## 2022-10-06 DIAGNOSIS — Z299 Encounter for prophylactic measures, unspecified: Secondary | ICD-10-CM | POA: Diagnosis not present

## 2022-10-06 DIAGNOSIS — Z0000 Encounter for general adult medical examination without abnormal findings: Secondary | ICD-10-CM | POA: Diagnosis not present

## 2022-10-06 DIAGNOSIS — Z7189 Other specified counseling: Secondary | ICD-10-CM | POA: Diagnosis not present

## 2022-10-06 DIAGNOSIS — F339 Major depressive disorder, recurrent, unspecified: Secondary | ICD-10-CM | POA: Diagnosis not present

## 2022-10-06 DIAGNOSIS — Z1331 Encounter for screening for depression: Secondary | ICD-10-CM | POA: Diagnosis not present

## 2022-10-14 DIAGNOSIS — R5383 Other fatigue: Secondary | ICD-10-CM | POA: Diagnosis not present

## 2022-10-14 DIAGNOSIS — E039 Hypothyroidism, unspecified: Secondary | ICD-10-CM | POA: Diagnosis not present

## 2022-10-14 DIAGNOSIS — E7800 Pure hypercholesterolemia, unspecified: Secondary | ICD-10-CM | POA: Diagnosis not present

## 2022-10-14 DIAGNOSIS — Z79899 Other long term (current) drug therapy: Secondary | ICD-10-CM | POA: Diagnosis not present

## 2023-01-27 DIAGNOSIS — C4491 Basal cell carcinoma of skin, unspecified: Secondary | ICD-10-CM | POA: Diagnosis not present

## 2023-01-27 DIAGNOSIS — D485 Neoplasm of uncertain behavior of skin: Secondary | ICD-10-CM | POA: Diagnosis not present

## 2023-01-27 DIAGNOSIS — C44319 Basal cell carcinoma of skin of other parts of face: Secondary | ICD-10-CM | POA: Diagnosis not present

## 2023-01-27 DIAGNOSIS — Z299 Encounter for prophylactic measures, unspecified: Secondary | ICD-10-CM | POA: Diagnosis not present

## 2023-02-26 DIAGNOSIS — C4481 Basal cell carcinoma of overlapping sites of skin: Secondary | ICD-10-CM | POA: Diagnosis not present

## 2023-02-26 DIAGNOSIS — Z299 Encounter for prophylactic measures, unspecified: Secondary | ICD-10-CM | POA: Diagnosis not present

## 2023-10-08 DIAGNOSIS — Z0000 Encounter for general adult medical examination without abnormal findings: Secondary | ICD-10-CM | POA: Diagnosis not present

## 2023-10-08 DIAGNOSIS — E7800 Pure hypercholesterolemia, unspecified: Secondary | ICD-10-CM | POA: Diagnosis not present

## 2023-10-08 DIAGNOSIS — F0390 Unspecified dementia without behavioral disturbance: Secondary | ICD-10-CM | POA: Diagnosis not present

## 2023-10-08 DIAGNOSIS — Z7189 Other specified counseling: Secondary | ICD-10-CM | POA: Diagnosis not present

## 2023-10-08 DIAGNOSIS — R5383 Other fatigue: Secondary | ICD-10-CM | POA: Diagnosis not present

## 2023-10-08 DIAGNOSIS — Z79899 Other long term (current) drug therapy: Secondary | ICD-10-CM | POA: Diagnosis not present

## 2023-10-08 DIAGNOSIS — F322 Major depressive disorder, single episode, severe without psychotic features: Secondary | ICD-10-CM | POA: Diagnosis not present

## 2023-10-08 DIAGNOSIS — Z299 Encounter for prophylactic measures, unspecified: Secondary | ICD-10-CM | POA: Diagnosis not present

## 2023-10-08 DIAGNOSIS — Z1339 Encounter for screening examination for other mental health and behavioral disorders: Secondary | ICD-10-CM | POA: Diagnosis not present

## 2023-10-08 DIAGNOSIS — Z1331 Encounter for screening for depression: Secondary | ICD-10-CM | POA: Diagnosis not present

## 2023-10-08 DIAGNOSIS — E039 Hypothyroidism, unspecified: Secondary | ICD-10-CM | POA: Diagnosis not present

## 2023-12-18 DIAGNOSIS — Z299 Encounter for prophylactic measures, unspecified: Secondary | ICD-10-CM | POA: Diagnosis not present

## 2023-12-18 DIAGNOSIS — D692 Other nonthrombocytopenic purpura: Secondary | ICD-10-CM | POA: Diagnosis not present

## 2023-12-18 DIAGNOSIS — S63502A Unspecified sprain of left wrist, initial encounter: Secondary | ICD-10-CM | POA: Diagnosis not present

## 2023-12-18 DIAGNOSIS — F419 Anxiety disorder, unspecified: Secondary | ICD-10-CM | POA: Diagnosis not present

## 2023-12-18 DIAGNOSIS — F0390 Unspecified dementia without behavioral disturbance: Secondary | ICD-10-CM | POA: Diagnosis not present

## 2024-05-25 DIAGNOSIS — F322 Major depressive disorder, single episode, severe without psychotic features: Secondary | ICD-10-CM | POA: Diagnosis not present

## 2024-05-25 DIAGNOSIS — L600 Ingrowing nail: Secondary | ICD-10-CM | POA: Diagnosis not present

## 2024-05-25 DIAGNOSIS — Z299 Encounter for prophylactic measures, unspecified: Secondary | ICD-10-CM | POA: Diagnosis not present

## 2024-05-25 DIAGNOSIS — F0390 Unspecified dementia without behavioral disturbance: Secondary | ICD-10-CM | POA: Diagnosis not present

## 2024-05-25 DIAGNOSIS — D692 Other nonthrombocytopenic purpura: Secondary | ICD-10-CM | POA: Diagnosis not present
